# Patient Record
Sex: Female | Born: 1980 | Race: White | Hispanic: No | Marital: Single | Smoking: Former smoker
Health system: Southern US, Community
[De-identification: ages and names within clinical notes are randomized; demographics above are authoritative.]

## PROBLEM LIST (undated history)

## (undated) DIAGNOSIS — M2550 Pain in unspecified joint: Secondary | ICD-10-CM

## (undated) DIAGNOSIS — L659 Nonscarring hair loss, unspecified: Secondary | ICD-10-CM

## (undated) DIAGNOSIS — F419 Anxiety disorder, unspecified: Secondary | ICD-10-CM

## (undated) DIAGNOSIS — M79643 Pain in unspecified hand: Secondary | ICD-10-CM

## (undated) DIAGNOSIS — H547 Unspecified visual loss: Secondary | ICD-10-CM

## (undated) DIAGNOSIS — I10 Essential (primary) hypertension: Secondary | ICD-10-CM

## (undated) DIAGNOSIS — R5382 Chronic fatigue, unspecified: Secondary | ICD-10-CM

## (undated) DIAGNOSIS — F329 Major depressive disorder, single episode, unspecified: Secondary | ICD-10-CM

## (undated) DIAGNOSIS — R5383 Other fatigue: Secondary | ICD-10-CM

## (undated) DIAGNOSIS — F909 Attention-deficit hyperactivity disorder, unspecified type: Secondary | ICD-10-CM

## (undated) DIAGNOSIS — K219 Gastro-esophageal reflux disease without esophagitis: Secondary | ICD-10-CM

## (undated) DIAGNOSIS — M26629 Arthralgia of temporomandibular joint, unspecified side: Secondary | ICD-10-CM

## (undated) DIAGNOSIS — E669 Obesity, unspecified: Secondary | ICD-10-CM

## (undated) HISTORY — DX: Pain in unspecified joint: M25.50

## (undated) HISTORY — DX: Pain in unspecified hand: M79.643

## (undated) HISTORY — DX: Obesity, unspecified: E66.9

## (undated) HISTORY — DX: Attention-deficit hyperactivity disorder, unspecified type: F90.9

## (undated) HISTORY — DX: Essential (primary) hypertension: I10

## (undated) HISTORY — DX: Arthralgia of temporomandibular joint, unspecified side: M26.629

## (undated) HISTORY — DX: Anxiety disorder, unspecified: F41.9

## (undated) HISTORY — DX: Chronic fatigue, unspecified: R53.82

## (undated) HISTORY — DX: Major depressive disorder, single episode, unspecified: F32.9

## (undated) HISTORY — DX: Other fatigue: R53.83

## (undated) HISTORY — DX: Unspecified visual loss: H54.7

## (undated) HISTORY — DX: Gastro-esophageal reflux disease without esophagitis: K21.9

## (undated) HISTORY — DX: Nonscarring hair loss, unspecified: L65.9

---
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
## 2005-11-27 ENCOUNTER — Emergency Department (HOSPITAL_COMMUNITY): Admission: EM | Admit: 2005-11-27 | Discharge: 2005-11-27 | Payer: Self-pay | Admitting: Emergency Medicine

## 2010-08-13 ENCOUNTER — Emergency Department (HOSPITAL_COMMUNITY): Admission: EM | Admit: 2010-08-13 | Discharge: 2010-05-27 | Payer: Self-pay | Admitting: Emergency Medicine

## 2010-09-01 ENCOUNTER — Ambulatory Visit (HOSPITAL_COMMUNITY)
Admission: RE | Admit: 2010-09-01 | Discharge: 2010-09-01 | Payer: Self-pay | Source: Home / Self Care | Attending: Obstetrics and Gynecology | Admitting: Obstetrics and Gynecology

## 2010-11-16 ENCOUNTER — Inpatient Hospital Stay (HOSPITAL_COMMUNITY)
Admission: AD | Admit: 2010-11-16 | Discharge: 2010-11-16 | Disposition: A | Discharge status: Home / Self Care | Payer: Medicaid Other | Source: Ambulatory Visit | Attending: Obstetrics and Gynecology | Admitting: Obstetrics and Gynecology

## 2010-11-16 DIAGNOSIS — O99891 Other specified diseases and conditions complicating pregnancy: Secondary | ICD-10-CM | POA: Insufficient documentation

## 2010-11-16 DIAGNOSIS — K5289 Other specified noninfective gastroenteritis and colitis: Secondary | ICD-10-CM | POA: Insufficient documentation

## 2010-11-19 LAB — URINALYSIS, ROUTINE W REFLEX MICROSCOPIC
Nitrite: NEGATIVE
Specific Gravity, Urine: 1.015 (ref 1.005–1.030)
Urobilinogen, UA: 0.2 mg/dL (ref 0.0–1.0)

## 2010-11-19 LAB — GC/CHLAMYDIA PROBE AMP, GENITAL
Chlamydia, DNA Probe: NEGATIVE
GC Probe Amp, Genital: NEGATIVE

## 2010-11-19 LAB — WET PREP, GENITAL

## 2010-11-27 ENCOUNTER — Inpatient Hospital Stay (HOSPITAL_COMMUNITY)
Admission: RE | Admit: 2010-11-27 | Discharge: 2010-11-29 | DRG: 775 | Disposition: A | Discharge status: Home / Self Care | Payer: Medicaid Other | Source: Ambulatory Visit | Attending: Obstetrics and Gynecology | Admitting: Obstetrics and Gynecology

## 2010-11-27 DIAGNOSIS — Z22330 Carrier of Group B streptococcus: Secondary | ICD-10-CM

## 2010-11-27 DIAGNOSIS — O99892 Other specified diseases and conditions complicating childbirth: Secondary | ICD-10-CM | POA: Diagnosis present

## 2010-11-27 LAB — CBC
MCH: 31 pg (ref 26.0–34.0)
MCHC: 33.2 g/dL (ref 30.0–36.0)
Platelets: 165 10*3/uL (ref 150–400)

## 2010-11-27 LAB — RPR: RPR Ser Ql: NONREACTIVE

## 2010-11-28 LAB — CBC
HCT: 33.9 % — ABNORMAL LOW (ref 36.0–46.0)
MCH: 30.6 pg (ref 26.0–34.0)
MCHC: 32.4 g/dL (ref 30.0–36.0)
RDW: 13.3 % (ref 11.5–15.5)

## 2010-12-10 NOTE — Discharge Summary (Signed)
Robin Owens, Robin Owens               ACCOUNT NO.:  [id number]  MEDICAL RECORD NO.:  [id number]           PATIENT TYPE:  I  LOCATION:  9108                          FACILITY:  [facility]  PHYSICIAN:  Malachi Pro. Ambrose Mantle, M.D. DATE OF BIRTH:  [birth year]  DATE OF ADMISSION:  11/27/2010 DATE OF DISCHARGE:  11/29/2010                              DISCHARGE SUMMARY   A 30 year old white female para 0-0-1-0, gravida 2, EDC December 01, 2010, admitted for induction of labor.  Blood group and type A+, negative antibody, rubella equivocal, RPR nonreactive.  Urine culture negative, hepatitis B surface antigen negative, HIV negative, GC and Chlamydia negative, cystic fibrosis negative.  First trimester screen negative, AFP negative, 1-hour Glucola 88, group B strep positive.  Ultrasound on May 06, 2010; estimated gestational age [redacted] weeks and 1 day, [facility] December 01, 2010.  Ultrasound on July 02, 2010; estimated gestational age [redacted] weeks and 3 days, [facility] November 30, 2010.  Heart could not be evaluated on the 18-week ultrasound, so final ultrasound at [facility] could not see the heart well either.  Prenatal care was uncomplicated.  Blood pressure had been borderline elevated.  PAST MEDICAL HISTORY:  No allergies.  No operations.  No illnesses. Alcohol, tobacco and drugs none.  FAMILY HISTORY:  Mother with high blood pressure.  OBSTETRIC HISTORY:  Early abortion on September 06, 2010 at 10 weeks.  PHYSICAL EXAM ON ADMISSION:  VITAL SIGNS:  Normal vital signs. HEART AND LUNGS:  Normal. ABDOMEN:  Soft.  Fundal height had been 37 cm on November 13, 2010.  Fetal heart tones were normal.  Cervix was 2 cm, 40% vertex at -2.  The patient was started on Pitocin and penicillin.  At 1:22 p.m., the Pitocin was at 14 milliunits a minute.  Contractions every 2 minutes rated at 4/10 on pain scale.  Cervix was 3 cm, 60% vertex at -3. Artificial rupture of membranes produced clear fluid.  By 5:50 p.m., the cervix was 6 cm,  90% vertex at -1 to -2 on 16 milliunits a minute of Pitocin.  The patient had received an epidural.  She progressed to full dilatation and pushed well.  She delivered spontaneously ROA over a second-degree midline laceration by Dr. Ambrose Mantle.  A living female infant 7 pounds and 14 ounces.  Apgars of 8 at 1 and 9 at 5 minutes.  Placenta was intact.  Uterus was normal.  Laceration difficult to repair with 3-0 Vicryl and 3-0 chromic used.  Blood loss was 500 mL.  Postpartum, the patient did well and was discharged on the second postpartum day. Initial hemoglobin 12.1, hematocrit 36.5, white count 10,000, platelet count 165,000.  Followup hemoglobin 11.0.  RPR was nonreactive.  FINAL DIAGNOSES:  Intrauterine pregnancy 39+ weeks, delivered ROA, operation spontaneous delivery ROA, repair of second-degree midline laceration.  FINAL CONDITION:  Improved.  INSTRUCTIONS:  Our regular discharge instruction booklet.  Percocet 5/325 15 tablets one every 4-6 hours as needed for pain and Motrin 600 mg 30 tablets one every 6 hours as needed for pain.  The patient is offered the rubella vaccine.  She will probably call  the office and ask for benefits for Mirena to be checked.     Malachi Pro. Ambrose Mantle, M.D.     TFH/MEDQ  D:  11/29/2010  T:  11/29/2010  Job:  [id number]  Electronically Signed by Tracey Harries M.D. on 12/10/2010 08:48:50 AM

## 2011-06-28 ENCOUNTER — Emergency Department (HOSPITAL_COMMUNITY)
Admission: EM | Admit: 2011-06-28 | Discharge: 2011-06-28 | Disposition: A | Discharge status: Home / Self Care | Payer: Medicaid Other | Attending: Emergency Medicine | Admitting: Emergency Medicine

## 2011-06-28 DIAGNOSIS — N61 Mastitis without abscess: Secondary | ICD-10-CM | POA: Insufficient documentation

## 2011-06-28 DIAGNOSIS — L0291 Cutaneous abscess, unspecified: Secondary | ICD-10-CM

## 2011-06-28 DIAGNOSIS — F17200 Nicotine dependence, unspecified, uncomplicated: Secondary | ICD-10-CM | POA: Insufficient documentation

## 2011-06-28 MED ORDER — DOXYCYCLINE HYCLATE 100 MG PO CAPS: 100.0000 mg | ORAL_CAPSULE | Freq: Two times a day (BID) | ORAL | Status: AC

## 2011-06-28 MED ORDER — DOXYCYCLINE HYCLATE 100 MG PO TABS
100.0000 mg | ORAL_TABLET | Freq: Once | ORAL | Status: AC
  Administered 2011-06-28: 100 mg via ORAL
  Filled 2011-06-28: qty 1

## 2011-06-28 NOTE — ED Notes (Signed)
?  abcess to right breast x 1 week

## 2011-06-29 NOTE — ED Provider Notes (Signed)
History     CSN: [id number] Arrival date & time: 06/28/2011  2:13 AM   First MD Initiated Contact with Patient 06/28/11 [phone]      Chief Complaint  Patient presents with  . Wound Check    (Consider location/radiation/quality/duration/timing/severity/associated sxs/prior treatment) HPI Comments: Seen 0223. Patient with red papule that develop a week ago to her right breast that got larger, more red, painful. Last night it opened and drained a large amount of purulent material. She denies fever, chills, nausea, vomiting. States that she applied a warm compress to the aarea which did not decrease the discomfort.  Patient is a 30 y.o. female presenting with abscess. The history is provided by the patient.  Abscess  This is a new (Patient with area of redness and swelling to right breast x 1 week that opened and drained purulent material last night.) problem. Affected Location: right breast. The problem is moderate. The abscess is characterized by redness, painfulness, swelling and draining. Pertinent negatives include no fever and no vomiting. She has received no recent medical care.    History reviewed. No pertinent past medical history.  History reviewed. No pertinent past surgical history.  No family history on file.  History  Substance Use Topics  . Smoking status: Current Everyday Smoker    Types: Cigarettes  . Smokeless tobacco: Not on file  . Alcohol Use: Yes     occ    OB History    Grav Para Term Preterm Abortions TAB SAB Ect Mult Living                  Review of Systems  Constitutional: Negative for fever.  Gastrointestinal: Negative for vomiting.  Skin:       Abscess to right breast  All other systems reviewed and are negative.    Allergies  Review of patient's allergies indicates no known allergies.  Home Medications   Current Outpatient Rx  Name Route Sig Dispense Refill  . DOXYCYCLINE HYCLATE 100 MG PO CAPS Oral Take 1 capsule (100 mg total) by  mouth 2 (two) times daily. 20 capsule 0    BP 144/76  Pulse 74  Temp(Src) 98.1 F (36.7 C) (Oral)  Resp 18  Ht 5\' 8"  (1.727 m)  Wt 220 lb (99.791 kg)  BMI 33.45 kg/m2  SpO2 100%  Physical Exam  Nursing note and vitals reviewed. Constitutional: She is oriented to person, place, and time. She appears well-developed and well-nourished.  HENT:  Head: Normocephalic and atraumatic.  Eyes: EOM are normal.  Neck: Normal range of motion.  Cardiovascular: Normal rate, normal heart sounds and intact distal pulses.   Pulmonary/Chest: Effort normal and breath sounds normal.       2 cm area to right breast with erythema, no fluctuance, mild induration, no drainage. Resolving abscess  Musculoskeletal: Normal range of motion.  Neurological: She is alert and oriented to person, place, and time.  Skin: Skin is dry.    ED Course  Procedures (including critical care time)  Labs Reviewed - No data to display No results found.   1. Abscess       MDM  Patient with abscess to right breast that opened and drained spontaneously. Healing.Pt stable in ED with no significant deterioration in condition. MDM Reviewed: nursing note and vitals           Nicoletta Dress. Colon Branch, MD 06/29/11 1106

## 2012-05-15 ENCOUNTER — Emergency Department (HOSPITAL_COMMUNITY)
Admission: EM | Admit: 2012-05-15 | Discharge: 2012-05-15 | Disposition: A | Discharge status: Home / Self Care | Payer: Self-pay | Attending: Emergency Medicine | Admitting: Emergency Medicine

## 2012-05-15 ENCOUNTER — Encounter (HOSPITAL_COMMUNITY): Payer: Self-pay | Admitting: Emergency Medicine

## 2012-05-15 DIAGNOSIS — N390 Urinary tract infection, site not specified: Secondary | ICD-10-CM | POA: Insufficient documentation

## 2012-05-15 DIAGNOSIS — M549 Dorsalgia, unspecified: Secondary | ICD-10-CM

## 2012-05-15 DIAGNOSIS — F17200 Nicotine dependence, unspecified, uncomplicated: Secondary | ICD-10-CM | POA: Insufficient documentation

## 2012-05-15 LAB — POCT PREGNANCY, URINE: Preg Test, Ur: NEGATIVE

## 2012-05-15 LAB — URINALYSIS, ROUTINE W REFLEX MICROSCOPIC
Hgb urine dipstick: NEGATIVE
Ketones, ur: NEGATIVE mg/dL
Protein, ur: NEGATIVE mg/dL
Urobilinogen, UA: 0.2 mg/dL (ref 0.0–1.0)

## 2012-05-15 LAB — WET PREP, GENITAL
Trich, Wet Prep: NONE SEEN
Yeast Wet Prep HPF POC: NONE SEEN

## 2012-05-15 MED ORDER — CEPHALEXIN 500 MG PO CAPS: 500.0000 mg | ORAL_CAPSULE | Freq: Four times a day (QID) | ORAL | Status: AC

## 2012-05-15 MED ORDER — OXYCODONE-ACETAMINOPHEN 5-325 MG PO TABS
1.0000 | ORAL_TABLET | Freq: Once | ORAL | Status: AC
  Administered 2012-05-15: 1 via ORAL
  Filled 2012-05-15: qty 1

## 2012-05-15 MED ORDER — OXYCODONE-ACETAMINOPHEN 5-325 MG PO TABS: 1.0000 | ORAL_TABLET | ORAL | Status: AC | PRN

## 2012-05-15 NOTE — ED Notes (Signed)
edp aware pt ready for pelvic exam 

## 2012-05-15 NOTE — ED Notes (Signed)
Pt states ginger ale relieved nausea.  Pt comfortable at this time.

## 2012-05-15 NOTE — ED Provider Notes (Signed)
History   This chart was scribed for Joya Gaskins, MD by Gerlean Ren. This patient was seen in room APA09/APA09 and the patient's care was started at 3:22PM.   CSN: [id number]  Arrival date & time 05/15/12  1023   First MD Initiated Contact with Patient 05/15/12 1458      Chief Complaint  Patient presents with  . Flank Pain    The history is provided by the patient. No language interpreter was used.   Robin Owens is a 31 y.o. female who presents to the Emergency Department complaining of sharp, non-radiating, gradually worsening left lower back and left flank pain that began 4 days ago and is noticeably worse in the morning when waking up.  Pt reports that pain is not worsened by any particular activity.  Pt denies fever, emesis, urinary symptoms, extremity weakness, kidney stones, cough, vaginal bleeding or discharge as associated symptoms.  Pt denies any unusual exertion that could have caused back pain.  Pt reports h/o urinary infections several years ago.  Pt is a current everyday smoker (0.5packs/day) and reports alcohol use.  PMH - none  History reviewed. No pertinent past surgical history.  History reviewed. No pertinent family history.  History  Substance Use Topics  . Smoking status: Current Everyday Smoker -- 0.5 packs/day    Types: Cigarettes  . Smokeless tobacco: Not on file  . Alcohol Use: Yes     occ    OB History    Grav Para Term Preterm Abortions TAB SAB Ect Mult Living                  Review of Systems  Constitutional: Negative for fever.  HENT: Negative for neck pain.   Respiratory: Negative for cough.   Gastrointestinal: Positive for nausea and abdominal pain (very mild, non-radiating). Negative for vomiting.  Genitourinary: Negative for dysuria, frequency, hematuria, vaginal bleeding and vaginal discharge.  Neurological: Negative for weakness.  All other systems reviewed and are negative.    Allergies  Review of patient's allergies  indicates no known allergies.  Home Medications  No current outpatient prescriptions on file.  BP 148/76  Pulse 83  Temp 98 F (36.7 C) (Oral)  Resp 16  Ht 5\' 8"  (1.727 m)  Wt 223 lb (101.152 kg)  BMI 33.91 kg/m2  SpO2 100%  Physical Exam CONSTITUTIONAL: Well developed/well nourished HEAD AND FACE: Normocephalic/atraumatic EYES: EOMI/PERRL ENMT: Mucous membranes moist NECK: supple no meningeal signs SPINE:entire spine nontender CV: S1/S2 noted, no murmurs/rubs/gallops noted LUNGS: Lungs are clear to auscultation bilaterally, no apparent distress ABDOMEN: soft, nontender, no rebound or guarding GU: left flank tenderness. No cmt.  No vag discharge or vag bleeding.  No adnexal tenderness or uterine tenderness.  No mass appreciated.  Chaperone present NEURO: Pt is awake/alert, moves all extremitiesx4 EXTREMITIES: pulses normal, full ROM SKIN: warm, color normal PSYCH: no abnormalities of mood noted  ED Course  Procedures  DIAGNOSTIC STUDIES: Oxygen Saturation is 100% on room air, normal by my interpretation.    COORDINATION OF CARE: 3:20PM- Ordered pain meds.  Informed pt that pelvic exam could be helpful and pt agreed.  Pt stable, feels well She reported to nurse this was similar to prior uti Will treat for uti, and give pain meds Otherwise well appearing, nontoxic.  Discussed strict return precautions ( worsened pain, fever/vomiting or weakness over next 24 hours) Pt has ride home  Labs Reviewed  URINALYSIS, ROUTINE W REFLEX MICROSCOPIC - Abnormal; Notable for the following:  Leukocytes, UA MODERATE (*)     All other components within normal limits  URINE MICROSCOPIC-ADD ON - Abnormal; Notable for the following:    Squamous Epithelial / LPF FEW (*)     All other components within normal limits  POCT PREGNANCY, URINE  SPECIMEN HOLD  GC/CHLAMYDIA PROBE AMP, GENITAL  WET PREP, GENITAL      MDM  Nursing notes including past medical history and social history  reviewed and considered in documentation Labs/vital reviewed and considered       I personally performed the services described in this documentation, which was scribed in my presence. The recorded information has been reviewed and considered.      Joya Gaskins, MD 05/15/12 1745

## 2012-05-15 NOTE — ED Notes (Signed)
Pt states pain to left flank at 10/10, with burning and urgency of urination. Pt states frequent UTIs in past.

## 2012-05-16 ENCOUNTER — Encounter (HOSPITAL_COMMUNITY): Payer: Self-pay | Admitting: *Deleted

## 2012-05-16 ENCOUNTER — Emergency Department (HOSPITAL_COMMUNITY)
Admission: EM | Admit: 2012-05-16 | Discharge: 2012-05-16 | Disposition: A | Discharge status: Home / Self Care | Payer: Self-pay | Attending: Emergency Medicine | Admitting: Emergency Medicine

## 2012-05-16 ENCOUNTER — Emergency Department (HOSPITAL_COMMUNITY): Payer: Self-pay

## 2012-05-16 DIAGNOSIS — M545 Low back pain, unspecified: Secondary | ICD-10-CM | POA: Insufficient documentation

## 2012-05-16 DIAGNOSIS — N83209 Unspecified ovarian cyst, unspecified side: Secondary | ICD-10-CM | POA: Insufficient documentation

## 2012-05-16 DIAGNOSIS — R109 Unspecified abdominal pain: Secondary | ICD-10-CM | POA: Insufficient documentation

## 2012-05-16 DIAGNOSIS — Z79899 Other long term (current) drug therapy: Secondary | ICD-10-CM | POA: Insufficient documentation

## 2012-05-16 DIAGNOSIS — F17200 Nicotine dependence, unspecified, uncomplicated: Secondary | ICD-10-CM | POA: Insufficient documentation

## 2012-05-16 LAB — GC/CHLAMYDIA PROBE AMP, GENITAL
Chlamydia, DNA Probe: NEGATIVE
GC Probe Amp, Genital: NEGATIVE

## 2012-05-16 MED ORDER — ONDANSETRON 8 MG PO TBDP
8.0000 mg | ORAL_TABLET | Freq: Once | ORAL | Status: AC
  Administered 2012-05-16: 8 mg via ORAL
  Filled 2012-05-16: qty 1

## 2012-05-16 MED ORDER — HYDROCODONE-ACETAMINOPHEN 5-500 MG PO TABS
1.0000 | ORAL_TABLET | Freq: Four times a day (QID) | ORAL | Status: AC | PRN
Start: 2012-05-16 — End: 2012-05-26

## 2012-05-16 MED ORDER — HYDROCODONE-ACETAMINOPHEN 5-325 MG PO TABS
2.0000 | ORAL_TABLET | Freq: Once | ORAL | Status: AC
  Administered 2012-05-16: 2 via ORAL
  Filled 2012-05-16: qty 2

## 2012-05-16 NOTE — ED Notes (Addendum)
Reports pain in left flank area, radiating around to front of abdomen.   reporting no improvement from yesterday.  Was seen here yesterday for same, also reporting some abdominal bloating and nausea.

## 2012-05-16 NOTE — ED Notes (Signed)
Pt ambulatory leaving. Pt left with discharge instructions and prescriptions and verbalized understanding of instructions. Pt had no questions. Pt instructed not to drive. Pts friends states she will be driving home.

## 2012-05-16 NOTE — ED Provider Notes (Signed)
History     CSN: [id number]  Arrival date & time 05/16/12  1944   First MD Initiated Contact with Patient 05/16/12 1957      Chief Complaint  Patient presents with  . Flank Pain    (Consider location/radiation/quality/duration/timing/severity/associated sxs/prior treatment) HPI Comments: Patient was seen here last night for left flank pain and diagnosed with a uti.  Patient states "they told me to come back if I wasn't better in 24 hours".  She is feeling no better despite antibiotics and pain medications that were given to her.  No fevers or chills.  No hematuria.  Patient is a 31 y.o. female presenting with flank pain. The history is provided by the patient.  Flank Pain This is a new problem. The current episode started 2 days ago. The problem occurs constantly. The problem has not changed since onset.Pertinent negatives include no abdominal pain. Nothing aggravates the symptoms. Nothing relieves the symptoms.    History reviewed. No pertinent past medical history.  History reviewed. No pertinent past surgical history.  History reviewed. No pertinent family history.  History  Substance Use Topics  . Smoking status: Current Everyday Smoker -- 0.5 packs/day    Types: Cigarettes  . Smokeless tobacco: Not on file  . Alcohol Use: Yes     occ    OB History    Grav Para Term Preterm Abortions TAB SAB Ect Mult Living                  Review of Systems  Gastrointestinal: Negative for abdominal pain.  Genitourinary: Positive for flank pain.  All other systems reviewed and are negative.    Allergies  Review of patient's allergies indicates no known allergies.  Home Medications   Current Outpatient Rx  Name Route Sig Dispense Refill  . CEPHALEXIN 500 MG PO CAPS Oral Take 1 capsule (500 mg total) by mouth 4 (four) times daily. 28 capsule 0  . CITALOPRAM HYDROBROMIDE 20 MG PO TABS Oral Take 20 mg by mouth every evening.    Marland Kitchen LEVONORGESTREL 20 MCG/24HR IU IUD  Intrauterine 1 each by Intrauterine route once.    . OXYCODONE-ACETAMINOPHEN 5-325 MG PO TABS Oral Take 1 tablet by mouth every 4 (four) hours as needed for pain. 5 tablet 0  . AZO TABS PO Oral Take 1 tablet by mouth daily as needed. For 2 days for pain relief      BP 133/91  Pulse 67  Temp 98.5 F (36.9 C) (Oral)  Resp 18  Ht 5\' 8"  (1.727 m)  Wt 223 lb (101.152 kg)  BMI 33.91 kg/m2  SpO2 100%  Physical Exam  Nursing note and vitals reviewed. Constitutional: She is oriented to person, place, and time. She appears well-developed and well-nourished. No distress.  HENT:  Head: Normocephalic and atraumatic.  Mouth/Throat: Oropharynx is clear and moist.  Neck: Normal range of motion. Neck supple.  Cardiovascular: Normal rate and regular rhythm.   No murmur heard. Pulmonary/Chest: Effort normal and breath sounds normal. No respiratory distress.  Abdominal: Soft. Bowel sounds are normal. She exhibits no distension. There is no tenderness.       There is mild cva ttp on the left.  Musculoskeletal: Normal range of motion.  Neurological: She is alert and oriented to person, place, and time.  Skin: Skin is warm and dry. She is not diaphoretic.    ED Course  Procedures (including critical care time)  Labs Reviewed - No data to display No results found.  No diagnosis found.    MDM  The patient returns to the ER after being seen yesterday for what was believed to be a uti.  She is not feeling better and was told to return if she was not.  Her exam is positive for ttp in the left lower abdomen and left lower back.  I elected to proceed with a ct scan of the abdomen and pelvis to rule out a kidney stone.  The ct did not reveal a stone, however it did show a 3 cm ovarian cyst.  She will be discharged to home with pain meds, prn follow up.          Geoffery Lyons, MD 05/16/12 2107

## 2012-05-16 NOTE — ED Notes (Addendum)
Pt states she feels like her abdomen is swollen. Pt states abdomen is tender. Normal bowel sounds present.

## 2012-05-17 LAB — URINE CULTURE

## 2013-08-23 IMAGING — CT CT ABD-PELV W/O CM
2 of 3 series · 9 of 46 positions shown, 11 images · non-contrast
Comparison: None.

CLINICAL DATA: Left flank pain.

CT ABDOMEN AND PELVIS WITHOUT CONTRAST
TECHNIQUE: Multidetector CT imaging of the abdomen and pelvis was
performed following the standard protocol without intravenous
contrast.

[Series 4: mpr coronal (id) · coronal · 0.79mm/px · 8 of 109 slices shown, 9 images]
[im 13/109  soft-tissue]
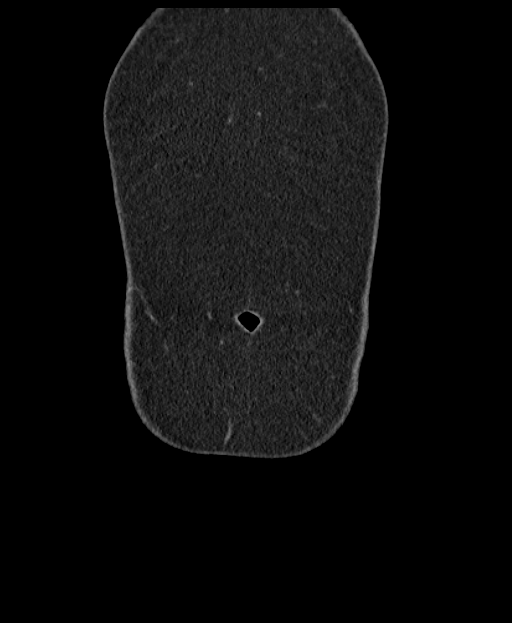
[im 13/109  bone]
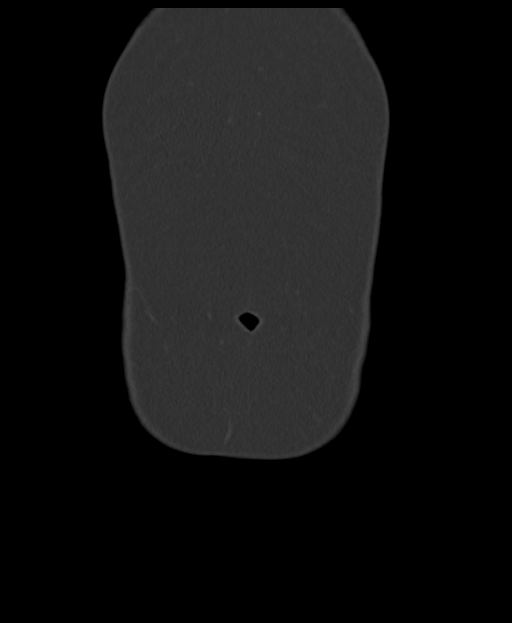
[im 25/109  soft-tissue]
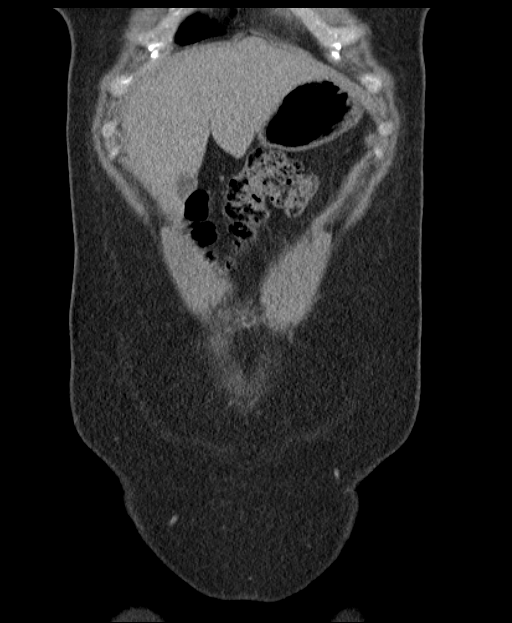
[im 37/109  soft-tissue]
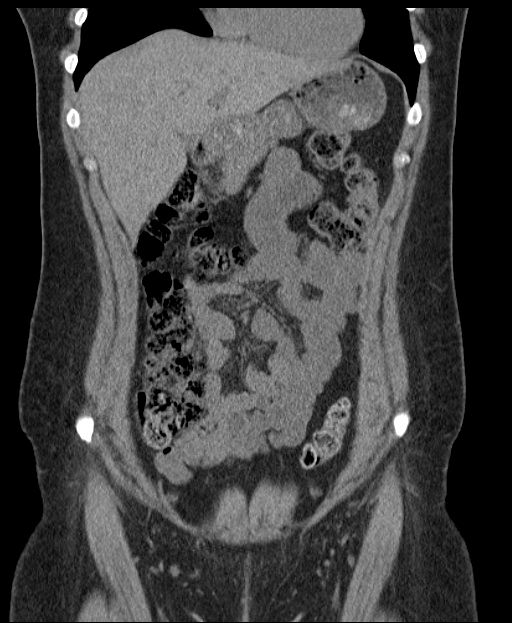
[im 49/109  soft-tissue]
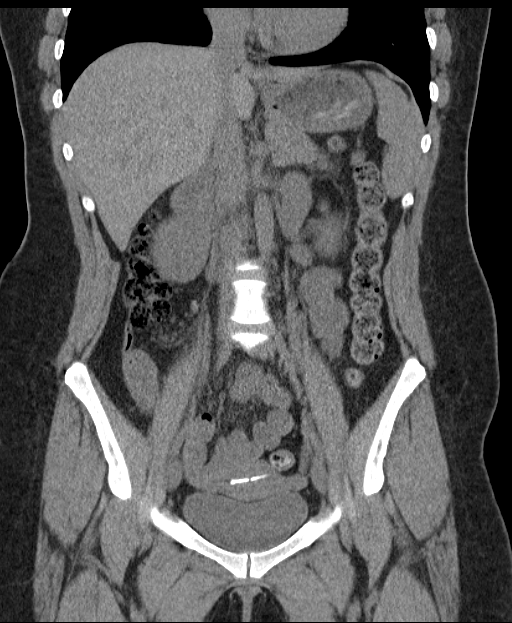
[im 61/109  soft-tissue]
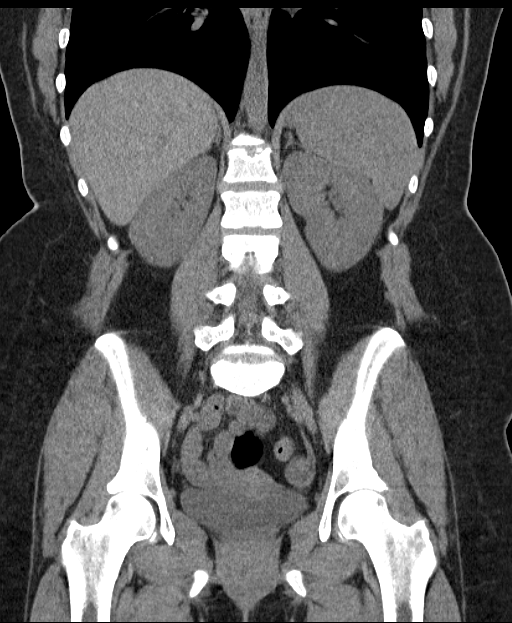
[im 73/109  soft-tissue]
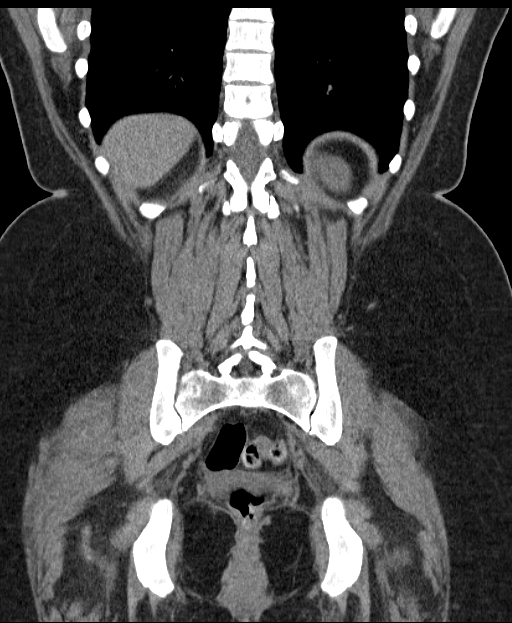
[im 85/109  soft-tissue]
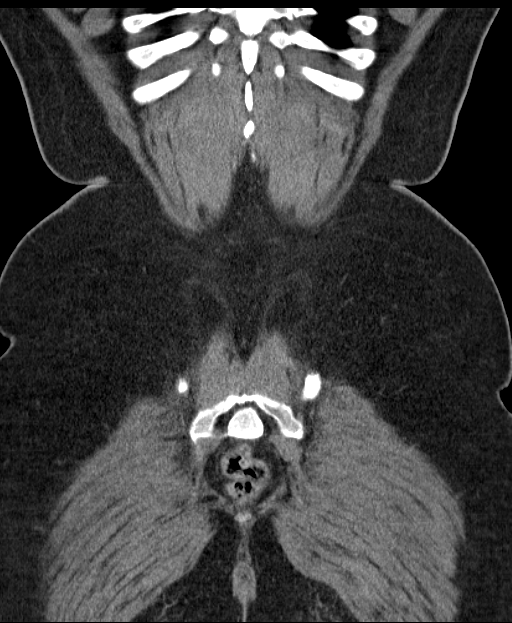
[im 97/109  soft-tissue]
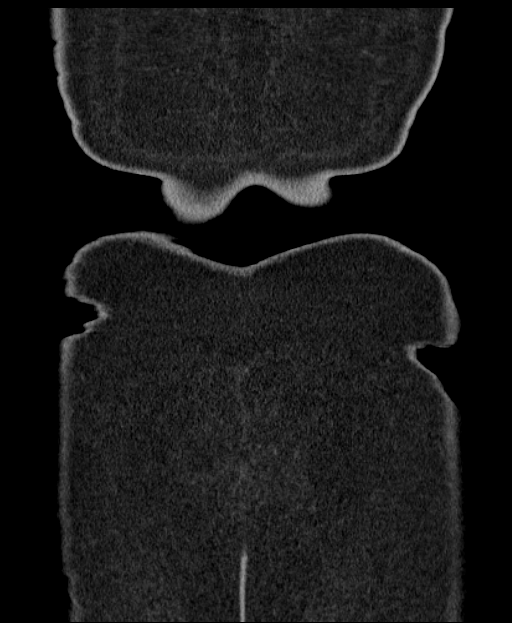

[Series 5: mpr sagittal (id) · sagittal · 0.65mm/px · 1 of 143 slices shown, 2 images]
[im 48/143  soft-tissue]
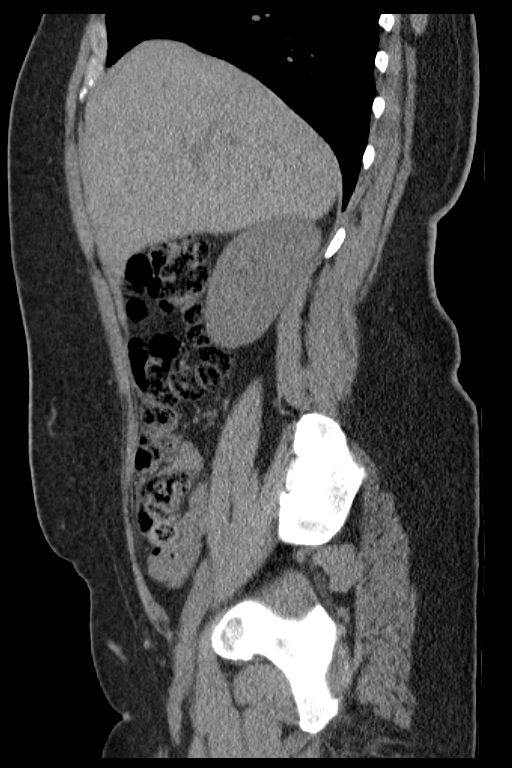
[im 48/143  bone]
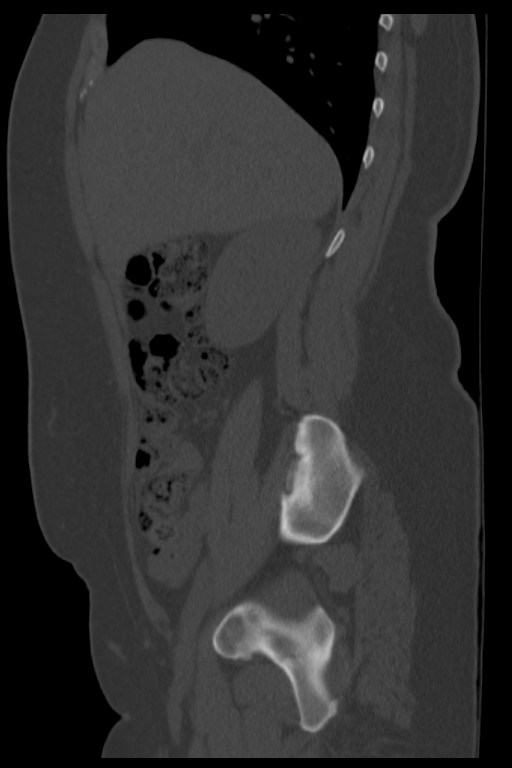

[9 of 46 positions shown; findings below may reference images not displayed]

FINDINGS: Subtle area of ground-glass opacity at the left lung base
is nonspecific and probably represents atelectasis.  A few small
punctate nodular densities at the left lung base.  No evidence of
free air.

No evidence for renal stones or hydronephrosis.  Normal appearance
of the liver, gallbladder and spleen.  No gross abnormality to the
pancreas, kidneys or adrenal glands.  The appendix has a normal
appearance.  There is an IUD present. Oval shaped structure
associated with the left ovary measures up to 3.2 cm and could
represent a cyst.  Limited evaluation of the right adnexa on this
noncontrast examination.  Fluid in the urinary bladder.  No acute
bony abnormality.  There are bilateral pars defects at L5.  No
evidence for anterolisthesis.
IMPRESSION: No evidence for kidney stones or hydronephrosis.

There is a 3.2 cm low density structure associated with the left
ovary which could represent a cyst.  If this is the area of
concern, this could further evaluated with a pelvic ultrasound.

Subtle area of ground-glass opacity at the left lung base most
likely represents atelectasis.  Early infection in this area cannot
be completely excluded.  There are a few punctate densities at the
left lung base which are likely incidental findings in a patient of
this age.

## 2022-10-08 NOTE — Progress Notes (Signed)
Office Visit Note  Patient: Robin Owens             Date of Birth: 06-21-1981           MRN: [id number]             PCP: Andres Shad, MD Referring: Andres Shad, * Visit Date: 10/22/2022 Occupation: @GUAROCC$ @  Subjective:  Fatigue, +ANA  History of Present Illness: Robin Owens is a 42 y.o. female seen in consultation per request of her PCP for the evaluation of joint pain and positive ANA.  According to the patient about 2 years ago she started experiencing lower back pain which was diagnosed as sciatica.  According to her she had MRI which showed some changes between L4 and L5.  She had epidural injections x 3 with improvement in her lower back pain.  She states 1 year later she got sick during the Christmas time and then her symptoms gradually improved.  2 months later she started having hair loss.  She states she developed a bald spot on the top of her crown and gradually 6 more bald spots.  She has been followed by dermatologist in [place] who did biopsy which was inconclusive.  She has been getting intralesional injections which has helped with the hair growth.  For the last 6 months she has been experiencing increased fatigue.  She states in December 2023 she had several recurrent viral infections.  She has been experiencing nausea and bloating.  She complains of discomfort in her shoulders and her arms.  She works 12 hours a day.  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  His 55 year old daughter.  She states she experiences leg cramps at times.  She notices discomfort in her shoulders, wrist joints in her hands.  None of the other joints are painful.  She has not noticed any joint swelling.  She complains of dry mouth, dry eyes, swollen glands, Raynauds and random rashes.  There is no history of oral ulcers, photosensitivity.  Patient states that her mother had rheumatoid arthritis, Crohn's disease and Addison's disease.  She lost her mom from heart  attack.  She is only child.  She is gravida 1, para 1.  She states she developed hypertension during the pregnancy.  No history of DVTs.    Activities of Daily Living:  Patient reports morning stiffness for 2 hours.   Patient Reports nocturnal pain.  Difficulty dressing/grooming: Denies Difficulty climbing stairs: Denies Difficulty getting out of chair: Denies Difficulty using hands for taps, buttons, cutlery, and/or writing: Reports  Review of Systems  Constitutional:  Positive for fatigue.  HENT:  Positive for mouth dryness.   Eyes:  Positive for dryness.  Respiratory:  Negative for shortness of breath.   Cardiovascular:  Positive for palpitations. Negative for chest pain.       Related to anxiety  Gastrointestinal:  Negative for blood in stool, constipation and diarrhea.  Endocrine: Positive for increased urination.  Genitourinary:  Negative for involuntary urination.  Musculoskeletal:  Positive for joint pain, joint pain, myalgias, morning stiffness and myalgias. Negative for gait problem, joint swelling, muscle weakness and muscle tenderness.  Skin:  Positive for color change, rash and hair loss. Negative for sensitivity to sunlight.  Allergic/Immunologic: Positive for susceptible to infections.  Neurological:  Positive for headaches. Negative for dizziness.  Hematological:  Positive for swollen glands.       Associated to illness in December  Psychiatric/Behavioral:  Positive for  depressed mood and sleep disturbance. The patient is nervous/anxious.     PMFS History:  Patient Active Problem List   Diagnosis Date Noted   History of ADHD 10/22/2022   Anxiety and depression 10/22/2022   History of gastroesophageal reflux (GERD) 10/22/2022    Past Medical History:  Diagnosis Date   ADHD    Alopecia    Anxiety    Chronic fatigue    Fatigue    GERD (gastroesophageal reflux disease)    Hand pain    Hypertension    Joint pain    MDD (major depressive disorder)     Obesity    TMJ syndrome    Visual loss     Family History  Problem Relation Age of Onset   Addison's disease Mother    Crohn's disease Mother    Diverticulitis Mother    Hypertension Mother    Rheum arthritis Mother    Ulcerative colitis Maternal Grandmother    History reviewed. No pertinent surgical history. Social History   Social History Narrative   Not on file    There is no immunization history on file for this patient.   Objective: Vital Signs: BP 135/85 (BP Location: Right Arm, Patient Position: Sitting, Cuff Size: Normal)   Pulse 76   Resp 12   Ht 5' 8"$  (1.727 m)   Wt 192 lb (87.1 kg)   BMI 29.19 kg/m    Physical Exam Vitals and nursing note reviewed.  Constitutional:      Appearance: She is well-developed.  HENT:     Head: Normocephalic and atraumatic.  Eyes:     Conjunctiva/sclera: Conjunctivae normal.  Cardiovascular:     Rate and Rhythm: Normal rate and regular rhythm.     Heart sounds: Normal heart sounds.  Pulmonary:     Effort: Pulmonary effort is normal.     Breath sounds: Normal breath sounds.  Abdominal:     General: Bowel sounds are normal.     Palpations: Abdomen is soft.  Musculoskeletal:     Cervical back: Normal range of motion.  Lymphadenopathy:     Cervical: No cervical adenopathy.  Skin:    General: Skin is warm and dry.     Capillary Refill: Capillary refill takes 2 to 3 seconds.     Comments: No sclerodactyly or telangiectasias noted.  No nailbed capillary changes noted.  Livedo reticularis noted.  Patchy alopecia noted.  Neurological:     Mental Status: She is alert and oriented to person, place, and time.  Psychiatric:        Behavior: Behavior normal.      Musculoskeletal Exam: Cervical, thoracic and lumbar spine were in good range of motion.  Shoulder joints, elbow joints, wrist joints, MCPs PIPs and DIPs been good range of motion.  She has some PIP and DIP thickening and callus formation.  Hip joints and knee joints with  good range of motion.  No warmth swelling or effusion was noted.  She had bilateral bunions and hammertoes.  Calluses were noted under her feet.  CDAI Exam: CDAI Score: -- Patient Global: --; Provider Global: -- Swollen: --; Tender: -- Joint Exam 10/22/2022   No joint exam has been documented for this visit   There is currently no information documented on the homunculus. Go to the Rheumatology activity and complete the homunculus joint exam.  Investigation: No additional findings.  Imaging: XR Foot 2 Views Right  Result Date: 10/22/2022 First MTP, PIP and DIP narrowing was noted.  No  intertarsal, tibiotalar or subtalar joint space narrowing was noted.    No erosive changes were noted. Impression: These findings are consistent with osteoarthritis of the foot.  XR Foot 2 Views Left  Result Date: 10/22/2022 First MTP, PIP and DIP narrowing was noted.  No intertarsal, tibiotalar or subtalar joint space narrowing was noted.  Small calcaneal spur was noted.  No erosive changes were noted. Impression: These findings are consistent with osteoarthritis of the foot.  XR Hand 2 View Left  Result Date: 10/22/2022 CMC, PIP and DIP narrowing was noted.  No MCP, intercarpal radiocarpal joint space narrowing was noted.  No erosive changes were noted. Impression: These findings are consistent with osteoarthritis of the hand.  XR Hand 2 View Right  Result Date: 10/22/2022 CMC, PIP and DIP narrowing was noted.  No MCP, intercarpal radiocarpal joint space narrowing was noted.  No erosive changes were noted. Impression: These findings are consistent with osteoarthritis of the hand.   Recent Labs: Lab Results  Component Value Date   WBC 13.2 (H) 11/28/2010   HGB 11.0 (L) 11/28/2010   PLT 147 (L) 11/28/2010    Speciality Comments: No specialty comments available.  Procedures:  No procedures performed Allergies: Patient has no known allergies.   Assessment / Plan:     Visit Diagnoses:  Positive ANA (antinuclear antibody) - 7/6/23ANA 1:80 spindle appartus,1:80 speckled,C3 156,C4 28,Chromatin-,dsDNA-,Ro-,La-,Smith-, anti-CCP-, CRP 1, RNP-, Scl-70-, ESR 2, RF10.2, Ck 136, Centromere- -patient has positive ANA low titer.  ENA panel was negative in the past.  She gives history of infrequent oral ulcers, nasal ulcers, fatigue, sicca symptoms, arthralgias, lymphadenopathy, Raynauds phenomenon.  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  Patchy alopecia was also noted.  I will repeat autoimmune panel today.  Plan: ANA, Anti-scleroderma antibody, RNP Antibody, Anti-Smith antibody, Sjogrens syndrome-A extractable nuclear antibody, Sjogrens syndrome-B extractable nuclear antibody, Anti-DNA antibody, double-stranded, C3 and C4, Beta-2 glycoprotein antibodies, Cardiolipin antibodies, IgG, IgM, IgA, Lupus Anticoagulant Eval w/Reflex.  She is currently on her menstrual cycle.  Will check urine protein creatinine ratio at the follow-up visit.  Alopecia-patient has patchy alopecia.  She has had intralesional injections which has been helpful to some extent.  She is still getting new lesions.  Patient states she had biopsy in the past which was inconclusive.  I will refer her to [facility] dermatology for evaluation.  Polyarthralgia-she complains of pain in multiple joints involving her shoulders, hands and her feet.  No synovitis was noted on the examination today.  Sicca complex-she continues to have dry mouth and dry eye symptoms.  Over-the-counter products were discussed.  Pain in both hands -she complains of discomfort in her hands.  She had mild PIP and DIP thickening and also callus formation.  She flips air B&B which is strenuous on her hands.  She also works as a Chief Operating Officer.  Plan: XR Hand 2 View Right, XR Hand 2 View Left, x-rays of bilateral hands were consistent with osteoarthritis.  Sedimentation rate  Pain in both feet -plaints of discomfort in her bilateral feet.  Bilateral  hammertoes, bunions and callus formation was noted.  No synovitis was noted.  Plan: XR Foot 2 Views Right, XR Foot 2 Views Left.  X-rays of bilateral feet are consistent with osteoarthritis.  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  I will obtain additional labs.  Plan: Beta-2 glycoprotein antibodies, Cardiolipin antibodies, IgG, IgM, IgA, Lupus Anticoagulant Eval w/Reflex  Raynaud's disease without gangrene -she gives history of  Raynaud's phenomenon for the last few years.  She denies any history of digital ulcers.  She decreased capillary refill.  No nailbed capillary changes were noted.  Plan: Beta-2 glycoprotein antibodies, Cardiolipin antibodies, IgG, IgM, IgA, Lupus Anticoagulant Eval w/Reflex  Chronic fatigue -she has been experiencing fatigue for the last 6 months.  I will obtain following labs today.  Plan: CBC with Differential/Platelet, COMPLETE METABOLIC PANEL WITH GFR, CK, TSH, Glucose 6 phosphate dehydrogenase  History of gastroesophageal reflux (GERD)-patient states that she takes omeprazole for digestive issues.  I advised her to switch to Pepcid.  As omeprazole can induce ration autoimmune diseases.  Anxiety and depression-she gives history of anxiety and depression.  She was tearful during the conversation.  She is currently on Celexa.  History of ADHD-she is on Adderall.  Family history of rheumatoid arthritis-mother.  Patient states that her mother had Addison's disease, rheumatoid arthritis and Crohn's disease.  She lost her mother from MI.  Orders: Orders Placed This Encounter  Procedures   XR Hand 2 View Right   XR Hand 2 View Left   XR Foot 2 Views Right   XR Foot 2 Views Left   CBC with Differential/Platelet   COMPLETE METABOLIC PANEL WITH GFR   CK   TSH   Sedimentation rate   ANA   Anti-scleroderma antibody   RNP Antibody   Anti-Smith antibody   Sjogrens syndrome-A extractable nuclear antibody   Sjogrens syndrome-B  extractable nuclear antibody   Anti-DNA antibody, double-stranded   C3 and C4   Beta-2 glycoprotein antibodies   Cardiolipin antibodies, IgG, IgM, IgA   Lupus Anticoagulant Eval w/Reflex   Glucose 6 phosphate dehydrogenase   No orders of the defined types were placed in this encounter.  Face-to-face time spent patient was 60 minutes.  Greater than 50% time was spent in counseling and coordination of care.  Follow-Up Instructions: Return for Positive ANA, Raynaud's, alopecia.   Bo Merino, MD  Note - This record has been created using Editor, commissioning.  Chart creation errors have been sought, but may not always  have been located. Such creation errors do not reflect on  the standard of medical care.

## 2022-10-22 ENCOUNTER — Ambulatory Visit: Payer: BLUE CROSS/BLUE SHIELD | Attending: Rheumatology | Admitting: Rheumatology

## 2022-10-22 ENCOUNTER — Ambulatory Visit: Payer: BLUE CROSS/BLUE SHIELD

## 2022-10-22 ENCOUNTER — Telehealth: Payer: Self-pay | Admitting: *Deleted

## 2022-10-22 ENCOUNTER — Ambulatory Visit (INDEPENDENT_AMBULATORY_CARE_PROVIDER_SITE_OTHER): Payer: BLUE CROSS/BLUE SHIELD

## 2022-10-22 ENCOUNTER — Encounter: Payer: Self-pay | Admitting: Rheumatology

## 2022-10-22 VITALS — BP 135/85 | HR 76 | Resp 12 | Ht 68.0 in | Wt 192.0 lb

## 2022-10-22 DIAGNOSIS — M2550 Pain in unspecified joint: Secondary | ICD-10-CM

## 2022-10-22 DIAGNOSIS — Z8261 Family history of arthritis: Secondary | ICD-10-CM

## 2022-10-22 DIAGNOSIS — R768 Other specified abnormal immunological findings in serum: Secondary | ICD-10-CM | POA: Diagnosis not present

## 2022-10-22 DIAGNOSIS — L659 Nonscarring hair loss, unspecified: Secondary | ICD-10-CM

## 2022-10-22 DIAGNOSIS — M79642 Pain in left hand: Secondary | ICD-10-CM | POA: Diagnosis not present

## 2022-10-22 DIAGNOSIS — I8390 Asymptomatic varicose veins of unspecified lower extremity: Secondary | ICD-10-CM | POA: Insufficient documentation

## 2022-10-22 DIAGNOSIS — R5382 Chronic fatigue, unspecified: Secondary | ICD-10-CM

## 2022-10-22 DIAGNOSIS — M79671 Pain in right foot: Secondary | ICD-10-CM

## 2022-10-22 DIAGNOSIS — M79672 Pain in left foot: Secondary | ICD-10-CM

## 2022-10-22 DIAGNOSIS — F419 Anxiety disorder, unspecified: Secondary | ICD-10-CM | POA: Insufficient documentation

## 2022-10-22 DIAGNOSIS — Z8719 Personal history of other diseases of the digestive system: Secondary | ICD-10-CM | POA: Insufficient documentation

## 2022-10-22 DIAGNOSIS — M3500 Sicca syndrome, unspecified: Secondary | ICD-10-CM

## 2022-10-22 DIAGNOSIS — R231 Pallor: Secondary | ICD-10-CM

## 2022-10-22 DIAGNOSIS — F32A Depression, unspecified: Secondary | ICD-10-CM

## 2022-10-22 DIAGNOSIS — Z8659 Personal history of other mental and behavioral disorders: Secondary | ICD-10-CM | POA: Insufficient documentation

## 2022-10-22 DIAGNOSIS — M79641 Pain in right hand: Secondary | ICD-10-CM

## 2022-10-22 DIAGNOSIS — I7300 Raynaud's syndrome without gangrene: Secondary | ICD-10-CM

## 2022-10-22 LAB — CBC WITH DIFFERENTIAL/PLATELET
Lymphs Abs: 1689 cells/uL (ref 850–3900)
Monocytes Relative: 7.1 %
Neutrophils Relative %: 52 %
Platelets: 267 10*3/uL (ref 140–400)

## 2022-10-22 NOTE — Telephone Encounter (Signed)
Referral placed per Dr. Deveshwar 

## 2022-10-23 LAB — COMPLETE METABOLIC PANEL WITH GFR
Albumin: 4.3 g/dL (ref 3.6–5.1)
Calcium: 9.5 mg/dL (ref 8.6–10.2)
Total Bilirubin: 0.6 mg/dL (ref 0.2–1.2)

## 2022-10-23 LAB — SJOGRENS SYNDROME-A EXTRACTABLE NUCLEAR ANTIBODY: SSA (Ro) (ENA) Antibody, IgG: <1 AI

## 2022-10-23 LAB — C3 AND C4: C3 Complement: 115 mg/dL (ref 83–193)

## 2022-10-23 LAB — SJOGRENS SYNDROME-B EXTRACTABLE NUCLEAR ANTIBODY: SSB (La) (ENA) Antibody, IgG: <1 AI

## 2022-10-23 LAB — CBC WITH DIFFERENTIAL/PLATELET
Absolute Monocytes: 391 cells/uL (ref 200–950)
MCH: 31 pg (ref 27.0–33.0)

## 2022-10-24 LAB — COMPLETE METABOLIC PANEL WITH GFR
Creat: 0.67 mg/dL (ref 0.50–0.97)
Potassium: 4.2 mmol/L (ref 3.5–5.3)
eGFR: 114 mL/min/{1.73_m2} (ref >=60)

## 2022-10-24 LAB — CBC WITH DIFFERENTIAL/PLATELET
Basophils Absolute: 50 cells/uL (ref 0–200)
Basophils Relative: 0.9 %
Neutro Abs: 2860 cells/uL (ref 1500–7800)

## 2022-10-24 LAB — LUPUS ANTICOAGULANT EVAL W/ REFLEX

## 2022-10-25 LAB — TSH: TSH: 1.37 mIU/L

## 2022-10-25 LAB — COMPLETE METABOLIC PANEL WITH GFR
ALT: 28 U/L (ref 6–29)
AST: 23 U/L (ref 10–30)
Alkaline phosphatase (APISO): 43 U/L (ref 31–125)
BUN: 15 mg/dL (ref 7–25)
Chloride: 105 mmol/L (ref 98–110)
Globulin: 2.9 g/dL (calc) (ref 1.9–3.7)
Total Protein: 7.2 g/dL (ref 6.1–8.1)

## 2022-10-25 LAB — LUPUS ANTICOAGULANT EVAL W/ REFLEX: dRVVT: 28 s (ref <=45)

## 2022-10-25 LAB — SEDIMENTATION RATE: Sed Rate: 9 mm/h (ref 0–20)

## 2022-10-25 LAB — ANTI-DNA ANTIBODY, DOUBLE-STRANDED: ds DNA Ab: <1 IU/mL

## 2022-10-25 LAB — ANA: Anti Nuclear Antibody (ANA): POSITIVE — AB

## 2022-10-25 LAB — CBC WITH DIFFERENTIAL/PLATELET
HCT: 39.3 % (ref 35.0–45.0)
Hemoglobin: 13.4 g/dL (ref 11.7–15.5)
MCHC: 34.1 g/dL (ref 32.0–36.0)
MCV: 91 fL (ref 80.0–100.0)

## 2022-10-25 LAB — ANTI-NUCLEAR AB-TITER (ANA TITER): ANA Titer 1: 1:40 {titer} — ABNORMAL HIGH

## 2022-10-25 LAB — CK: Total CK: 170 U/L — ABNORMAL HIGH (ref 29–143)

## 2022-10-26 LAB — CBC WITH DIFFERENTIAL/PLATELET
Eosinophils Relative: 9.3 %
RDW: 11.7 % (ref 11.0–15.0)
Total Lymphocyte: 30.7 %
WBC: 5.5 10*3/uL (ref 3.8–10.8)

## 2022-10-26 LAB — CARDIOLIPIN ANTIBODIES, IGG, IGM, IGA: Anticardiolipin IgM: 3 MPL-U/mL (ref <20.0)

## 2022-10-26 LAB — RNP ANTIBODY: Ribonucleic Protein(ENA) Antibody, IgG: <1 AI

## 2022-10-26 LAB — BETA-2 GLYCOPROTEIN ANTIBODIES: Beta-2 Glyco 1 IgM: 3.2 U/mL (ref <20.0)

## 2022-10-26 LAB — ANTI-SMITH ANTIBODY: ENA SM Ab Ser-aCnc: <1 AI

## 2022-10-26 LAB — ANTI-SCLERODERMA ANTIBODY: Scleroderma (Scl-70) (ENA) Antibody, IgG: <1 AI

## 2022-10-26 NOTE — Progress Notes (Signed)
I will discuss results at the follow-up visit.

## 2022-10-28 LAB — CBC WITH DIFFERENTIAL/PLATELET
Eosinophils Absolute: 512 cells/uL — ABNORMAL HIGH (ref 15–500)
MPV: 10.8 fL (ref 7.5–12.5)
RBC: 4.32 10*6/uL (ref 3.80–5.10)

## 2022-10-28 LAB — COMPLETE METABOLIC PANEL WITH GFR
AG Ratio: 1.5 (calc) (ref 1.0–2.5)
CO2: 28 mmol/L (ref 20–32)
Glucose, Bld: 77 mg/dL (ref 65–99)
Sodium: 141 mmol/L (ref 135–146)

## 2022-10-28 LAB — CARDIOLIPIN ANTIBODIES, IGG, IGM, IGA
Anticardiolipin IgA: <2 APL-U/mL (ref <20.0)
Anticardiolipin IgG: <2 GPL-U/mL (ref <20.0)

## 2022-10-28 LAB — GLUCOSE 6 PHOSPHATE DEHYDROGENASE: G-6PDH: 15.4 U/g Hgb (ref 7.0–20.5)

## 2022-10-28 LAB — BETA-2 GLYCOPROTEIN ANTIBODIES
Beta-2 Glyco 1 IgA: <2 U/mL (ref <20.0)
Beta-2 Glyco I IgG: <2 U/mL (ref <20.0)

## 2022-10-28 LAB — C3 AND C4: C4 Complement: 25 mg/dL (ref 15–57)

## 2022-10-28 LAB — LUPUS ANTICOAGULANT EVAL W/ REFLEX: PTT-LA Screen: 32 s (ref <=40)

## 2022-10-28 LAB — ANTI-NUCLEAR AB-TITER (ANA TITER)

## 2022-10-29 NOTE — Progress Notes (Signed)
Office Visit Note  Patient: Robin Owens             Date of Birth: 1981-01-24           MRN: [id number]             PCP: Andres Shad, MD Referring: Andres Shad, * Visit Date: 11/12/2022 Occupation: @GUAROCC @  Subjective:  Positive ANA, hair loss and Raynauds  History of Present Illness: Robin Owens is a 42 y.o. female returns today after her last visit on February 16.  She states she continues to have some hair loss and joint pain.  She gives history of Raynaud's phenomenon and mottling of the skin.  She complains of dry mouth and dry eye symptoms.  She continues to have discomfort in her hands, and bilateral feet.  She has not noticed any joint swelling.  She is very active and works all day.  She also rides horses.    Activities of Daily Living:  Patient reports morning stiffness for 1 hour.   Patient Denies nocturnal pain.  Difficulty dressing/grooming: Denies Difficulty climbing stairs: Denies Difficulty getting out of chair: Denies Difficulty using hands for taps, buttons, cutlery, and/or writing: Denies  Review of Systems  Constitutional:  Positive for fatigue.  HENT: Negative.  Negative for mouth sores and mouth dryness.   Eyes:  Positive for dryness.  Respiratory: Negative.  Negative for shortness of breath.   Cardiovascular:  Positive for palpitations. Negative for chest pain.  Gastrointestinal: Negative.  Negative for blood in stool, constipation and diarrhea.  Endocrine: Negative.  Negative for increased urination.  Genitourinary: Negative.  Negative for involuntary urination.  Musculoskeletal:  Positive for joint swelling, myalgias, morning stiffness, muscle tenderness and myalgias. Negative for joint pain, gait problem, joint pain and muscle weakness.  Skin:  Positive for hair loss. Negative for color change, rash and sensitivity to sunlight.  Allergic/Immunologic: Negative.  Negative for susceptible to infections.  Neurological:   Positive for dizziness and headaches.  Hematological: Negative.  Negative for swollen glands.  Psychiatric/Behavioral:  Positive for depressed mood and sleep disturbance. The patient is not nervous/anxious.     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   History of ADHD 10/22/2022   Anxiety and depression 10/22/2022   History of gastroesophageal reflux (GERD) 10/22/2022    Past Medical History:  Diagnosis Date   ADHD    Alopecia    Anxiety    Chronic fatigue    Fatigue    GERD (gastroesophageal reflux disease)    Hand pain    Hypertension    Joint pain    MDD (major depressive disorder)    Obesity    TMJ syndrome    Visual loss     Family History  Problem Relation Age of Onset   Addison's disease Mother    Crohn's disease Mother    Diverticulitis Mother    Hypertension Mother    Rheum arthritis Mother    Ulcerative colitis Maternal Grandmother    History reviewed. No pertinent surgical history. Social History   Social History Narrative   Not on file    There is no immunization history on file for this patient.   Objective: Vital Signs: BP 126/80 (BP Location: Left Arm, Patient Position: Sitting, Cuff Size: Normal)   Pulse 79   Resp 16   Ht 5\' 8"  (1.727 m)  Wt 196 lb 0.2 oz (88.9 kg)   BMI 29.80 kg/m    Physical Exam Vitals and nursing note reviewed.  Constitutional:      Appearance: She is well-developed.  HENT:     Head: Normocephalic and atraumatic.  Eyes:     Conjunctiva/sclera: Conjunctivae normal.  Cardiovascular:     Rate and Rhythm: Normal rate and regular rhythm.     Heart sounds: Normal heart sounds.  Pulmonary:     Effort: Pulmonary effort is normal.     Breath sounds: Normal breath sounds.  Abdominal:     General: Bowel sounds are normal.     Palpations: Abdomen is soft.  Musculoskeletal:     Cervical back: Normal range of  motion.  Lymphadenopathy:     Cervical: No cervical adenopathy.  Skin:    General: Skin is warm and dry.     Capillary Refill: Capillary refill takes less than 2 seconds.     Comments: Libido reticularis was noted.  No nailbed capillary changes or sclerodactyly was noted.  Neurological:     Mental Status: She is alert and oriented to person, place, and time.  Psychiatric:        Behavior: Behavior normal.      Musculoskeletal Exam: Cervical, thoracic and lumbar spine were in good range of motion.  Shoulders, elbow joints, wrist joints, MCPs PIPs and DIPs been good range of motion with no synovitis.  Hip joints, knee joints, ankles, MTPs and PIPs been good range of motion with no synovitis.  CDAI Exam: CDAI Score: -- Patient Global: --; Provider Global: -- Swollen: --; Tender: -- Joint Exam 11/12/2022   No joint exam has been documented for this visit   There is currently no information documented on the homunculus. Go to the Rheumatology activity and complete the homunculus joint exam.  Investigation: No additional findings.  Imaging: XR Foot 2 Views Right  Result Date: 10/22/2022 First MTP, PIP and DIP narrowing was noted.  No intertarsal, tibiotalar or subtalar joint space narrowing was noted.    No erosive changes were noted. Impression: These findings are consistent with osteoarthritis of the foot.  XR Foot 2 Views Left  Result Date: 10/22/2022 First MTP, PIP and DIP narrowing was noted.  No intertarsal, tibiotalar or subtalar joint space narrowing was noted.  Small calcaneal spur was noted.  No erosive changes were noted. Impression: These findings are consistent with osteoarthritis of the foot.  XR Hand 2 View Left  Result Date: 10/22/2022 CMC, PIP and DIP narrowing was noted.  No MCP, intercarpal radiocarpal joint space narrowing was noted.  No erosive changes were noted. Impression: These findings are consistent with osteoarthritis of the hand.  XR Hand 2 View  Right  Result Date: 10/22/2022 CMC, PIP and DIP narrowing was noted.  No MCP, intercarpal radiocarpal joint space narrowing was noted.  No erosive changes were noted. Impression: These findings are consistent with osteoarthritis of the hand.   Recent Labs: Lab Results  Component Value Date   WBC 5.5 10/22/2022   HGB 13.4 10/22/2022   PLT 267 10/22/2022   NA 141 10/22/2022   K 4.2 10/22/2022   CL 105 10/22/2022   CO2 28 10/22/2022   GLUCOSE 77 10/22/2022   BUN 15 10/22/2022   CREATININE 0.67 10/22/2022   BILITOT 0.6 10/22/2022   AST 23 10/22/2022   ALT 28 10/22/2022   PROT 7.2 10/22/2022   CALCIUM 9.5 10/22/2022   October 22, 2022 ANA 1: 40 NS, ENA (  dsDNA, SSA, SSB, Smith, RNP, SCL 70) negative, beta-2 GP 1 negative, anticardiolipin negative, lupus anticoagulant negative, C3-C4 normal, CK170, TSH normal, sed rate 9, G6PD normal  Speciality Comments: No specialty comments available.  Procedures:  No procedures performed Allergies: Patient has no known allergies.   Assessment / Plan:     Visit Diagnoses: Positive ANA (antinuclear antibody) - ANA low titer, not significant change, ANA negative, beta-2 negative, and cardiolipin negative, complements normal.  History of alopecia, arthralgias and livedo reticularis.  Left findings were discussed with the patient at length.  All autoimmune workup was negative.  I advised her to contact me if she develops any new symptoms.  I will see her again in 1 year.  Alopecia - Patchy alopecia.  Patient was referred to [facility] dermatology.  I advised her to contact me after her dermatology visit to give me an update.  Raynaud's disease without gangrene - History of Raynauds for the last few years.  No nailbed capillary changes, telangiectasia or sclerodactyly was noted.  All autoimmune labs were negative.  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  All autoimmune workup negative.  Chronic fatigue -  History of fatigue for the last 6 months.  Polyarthralgia - Pain in shoulders, hands and feet.  No sign of this was noted.  Primary osteoarthritis of both hands - Clinical and radiographic findings were consistent with early osteoarthritis.  Callus formation was noted over the palmar aspect.  Primary osteoarthritis of both feet - Clinical and radiographic findings are consistent with osteoarthritis.  Hammertoes and bunions with callus formation was noted.  History of gastroesophageal reflux (GERD)  Anxiety and depression - She is on Celexa.  History of ADHD - She is on Adderall.  Family history of rheumatoid arthritis-mother  Orders: No orders of the defined types were placed in this encounter.  No orders of the defined types were placed in this encounter.    Follow-Up Instructions: Return in about 1 year (around 11/12/2023) for Raynauds.   Bo Merino, MD  Note - This record has been created using Editor, commissioning.  Chart creation errors have been sought, but may not always  have been located. Such creation errors do not reflect on  the standard of medical care.

## 2022-11-12 ENCOUNTER — Encounter: Payer: Self-pay | Admitting: Rheumatology

## 2022-11-12 ENCOUNTER — Ambulatory Visit: Payer: BLUE CROSS/BLUE SHIELD | Attending: Rheumatology | Admitting: Rheumatology

## 2022-11-12 VITALS — BP 126/80 | HR 79 | Resp 16 | Ht 68.0 in | Wt 196.0 lb

## 2022-11-12 DIAGNOSIS — Z8261 Family history of arthritis: Secondary | ICD-10-CM

## 2022-11-12 DIAGNOSIS — L659 Nonscarring hair loss, unspecified: Secondary | ICD-10-CM

## 2022-11-12 DIAGNOSIS — Z8719 Personal history of other diseases of the digestive system: Secondary | ICD-10-CM

## 2022-11-12 DIAGNOSIS — M2550 Pain in unspecified joint: Secondary | ICD-10-CM

## 2022-11-12 DIAGNOSIS — M19072 Primary osteoarthritis, left ankle and foot: Secondary | ICD-10-CM

## 2022-11-12 DIAGNOSIS — M19071 Primary osteoarthritis, right ankle and foot: Secondary | ICD-10-CM

## 2022-11-12 DIAGNOSIS — R768 Other specified abnormal immunological findings in serum: Secondary | ICD-10-CM | POA: Diagnosis not present

## 2022-11-12 DIAGNOSIS — R7689 Other specified abnormal immunological findings in serum: Secondary | ICD-10-CM

## 2022-11-12 DIAGNOSIS — M19042 Primary osteoarthritis, left hand: Secondary | ICD-10-CM

## 2022-11-12 DIAGNOSIS — R231 Pallor: Secondary | ICD-10-CM | POA: Diagnosis not present

## 2022-11-12 DIAGNOSIS — I7300 Raynaud's syndrome without gangrene: Secondary | ICD-10-CM | POA: Diagnosis not present

## 2022-11-12 DIAGNOSIS — M19041 Primary osteoarthritis, right hand: Secondary | ICD-10-CM

## 2022-11-12 DIAGNOSIS — F32A Depression, unspecified: Secondary | ICD-10-CM

## 2022-11-12 DIAGNOSIS — Z8659 Personal history of other mental and behavioral disorders: Secondary | ICD-10-CM

## 2022-11-12 DIAGNOSIS — F419 Anxiety disorder, unspecified: Secondary | ICD-10-CM

## 2022-11-12 DIAGNOSIS — R5382 Chronic fatigue, unspecified: Secondary | ICD-10-CM

## 2023-10-28 NOTE — Progress Notes (Deleted)
 Office Visit Note  Patient: Robin Owens             Date of Birth: [birth year]           MRN: [id number]             PCP: Arlina Robes, MD Referring: Arlina Robes, * Visit Date: 11/11/2023 Occupation: @GUAROCC @  Subjective:  No chief complaint on file.   History of Present Illness: KAMY POINSETT is a 43 y.o. female ***     Activities of Daily Living:  Patient reports morning stiffness for *** {minute/hour:19697}.   Patient {ACTIONS;DENIES/REPORTS:21021675::"Denies"} nocturnal pain.  Difficulty dressing/grooming: {ACTIONS;DENIES/REPORTS:21021675::"Denies"} Difficulty climbing stairs: {ACTIONS;DENIES/REPORTS:21021675::"Denies"} Difficulty getting out of chair: {ACTIONS;DENIES/REPORTS:21021675::"Denies"} Difficulty using hands for taps, buttons, cutlery, and/or writing: {ACTIONS;DENIES/REPORTS:21021675::"Denies"}  No Rheumatology ROS completed.   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   History of ADHD 10/22/2022   Anxiety and depression 10/22/2022   History of gastroesophageal reflux (GERD) 10/22/2022    Past Medical History:  Diagnosis Date   ADHD    Alopecia    Anxiety    Chronic fatigue    Fatigue    GERD (gastroesophageal reflux disease)    Hand pain    Hypertension    Joint pain    MDD (major depressive disorder)    Obesity    TMJ syndrome    Visual loss     Family History  Problem Relation Age of Onset   Addison's disease Mother    Crohn's disease Mother    Diverticulitis Mother    Hypertension Mother    Rheum arthritis Mother    Ulcerative colitis Maternal Grandmother    No past surgical history on file. Social History   Social History Narrative   Not on file    There is no immunization history on file for this patient.   Objective: Vital Signs: There were no vitals taken for this visit.   Physical Exam    Musculoskeletal Exam: ***  CDAI Exam: CDAI Score: -- Patient Global: --; Provider Global: -- Swollen: --; Tender: -- Joint Exam 11/11/2023   No joint exam has been documented for this visit   There is currently no information documented on the homunculus. Go to the Rheumatology activity and complete the homunculus joint exam.  Investigation: No additional findings.  Imaging: No results found.  Recent Labs: Lab Results  Component Value Date   WBC 5.5 10/22/2022   HGB 13.4 10/22/2022   PLT 267 10/22/2022   NA 141 10/22/2022   K 4.2 10/22/2022   CL 105 10/22/2022   CO2 28 10/22/2022   GLUCOSE 77 10/22/2022   BUN 15 10/22/2022   CREATININE 0.67 10/22/2022   BILITOT 0.6 10/22/2022   AST 23 10/22/2022   ALT 28 10/22/2022   PROT 7.2 10/22/2022   CALCIUM 9.5 10/22/2022    Speciality Comments: No specialty comments available.  Procedures:  No procedures performed Allergies: Patient has no known allergies.   Assessment / Plan:     Visit Diagnoses: No diagnosis found.  Orders: No orders of the defined types were placed in this encounter.  No orders of the defined types were placed in this encounter.   Face-to-face time spent with patient was *** minutes. Greater than 50% of time was spent in counseling and coordination of care.  Follow-Up Instructions: No follow-ups on file.   Ellen Henri, CMA  Note - This record has been created using [employer].  Chart creation errors have been sought, but may not always  have been located. Such creation errors do not reflect on  the standard of medical care.

## 2023-11-11 ENCOUNTER — Ambulatory Visit: Payer: BLUE CROSS/BLUE SHIELD | Admitting: Rheumatology

## 2023-11-11 DIAGNOSIS — Z8261 Family history of arthritis: Secondary | ICD-10-CM

## 2023-11-11 DIAGNOSIS — M19071 Primary osteoarthritis, right ankle and foot: Secondary | ICD-10-CM

## 2023-11-11 DIAGNOSIS — M19041 Primary osteoarthritis, right hand: Secondary | ICD-10-CM

## 2023-11-11 DIAGNOSIS — R768 Other specified abnormal immunological findings in serum: Secondary | ICD-10-CM

## 2023-11-11 DIAGNOSIS — M2550 Pain in unspecified joint: Secondary | ICD-10-CM

## 2023-11-11 DIAGNOSIS — I7300 Raynaud's syndrome without gangrene: Secondary | ICD-10-CM

## 2023-11-11 DIAGNOSIS — R231 Pallor: Secondary | ICD-10-CM

## 2023-11-11 DIAGNOSIS — Z8659 Personal history of other mental and behavioral disorders: Secondary | ICD-10-CM

## 2023-11-11 DIAGNOSIS — L659 Nonscarring hair loss, unspecified: Secondary | ICD-10-CM

## 2023-11-11 DIAGNOSIS — Z8719 Personal history of other diseases of the digestive system: Secondary | ICD-10-CM

## 2023-11-11 DIAGNOSIS — F419 Anxiety disorder, unspecified: Secondary | ICD-10-CM

## 2023-11-11 DIAGNOSIS — R5382 Chronic fatigue, unspecified: Secondary | ICD-10-CM
